# Patient Record
Sex: Female | Born: 1985 | Race: White | Hispanic: No | Marital: Married | Smoking: Never smoker
Health system: Southern US, Community
[De-identification: ages and names within clinical notes are randomized; demographics above are authoritative.]

## PROBLEM LIST (undated history)

## (undated) DIAGNOSIS — F32A Depression, unspecified: Secondary | ICD-10-CM

## (undated) DIAGNOSIS — R87810 Cervical high risk human papillomavirus (HPV) DNA test positive: Secondary | ICD-10-CM

## (undated) DIAGNOSIS — G43829 Menstrual migraine, not intractable, without status migrainosus: Secondary | ICD-10-CM

## (undated) DIAGNOSIS — F329 Major depressive disorder, single episode, unspecified: Secondary | ICD-10-CM

## (undated) DIAGNOSIS — F419 Anxiety disorder, unspecified: Secondary | ICD-10-CM

## (undated) DIAGNOSIS — R87610 Atypical squamous cells of undetermined significance on cytologic smear of cervix (ASC-US): Secondary | ICD-10-CM

## (undated) DIAGNOSIS — N946 Dysmenorrhea, unspecified: Secondary | ICD-10-CM

## (undated) HISTORY — DX: Menstrual migraine, not intractable, without status migrainosus: G43.829

## (undated) HISTORY — DX: Atypical squamous cells of undetermined significance on cytologic smear of cervix (ASC-US): R87.610

## (undated) HISTORY — DX: Anxiety disorder, unspecified: F41.9

## (undated) HISTORY — DX: Cervical high risk human papillomavirus (HPV) DNA test positive: R87.810

## (undated) HISTORY — DX: Dysmenorrhea, unspecified: N94.6

## (undated) HISTORY — DX: Major depressive disorder, single episode, unspecified: F32.9

## (undated) HISTORY — DX: Depression, unspecified: F32.A

---

## 2011-05-11 DIAGNOSIS — R87610 Atypical squamous cells of undetermined significance on cytologic smear of cervix (ASC-US): Secondary | ICD-10-CM

## 2011-05-11 HISTORY — DX: Atypical squamous cells of undetermined significance on cytologic smear of cervix (ASC-US): R87.610

## 2011-05-11 HISTORY — PX: COLPOSCOPY: SHX161

## 2012-03-04 ENCOUNTER — Ambulatory Visit: Payer: Self-pay

## 2012-09-01 ENCOUNTER — Ambulatory Visit: Payer: Self-pay | Admitting: Family Medicine

## 2012-11-23 ENCOUNTER — Ambulatory Visit: Payer: Self-pay

## 2014-05-25 IMAGING — CR DG ANKLE COMPLETE 3+V*L*
1 series · 6 of 6 positions shown · non-contrast
Comparison: none

REASON FOR EXAM: Fall today on 11/23/2012 down bottom step and twisted
ankle
COMMENTS:

[Series 1: ap · 0.17mm/px · 6 of 6 slices shown]
[im 1/6]
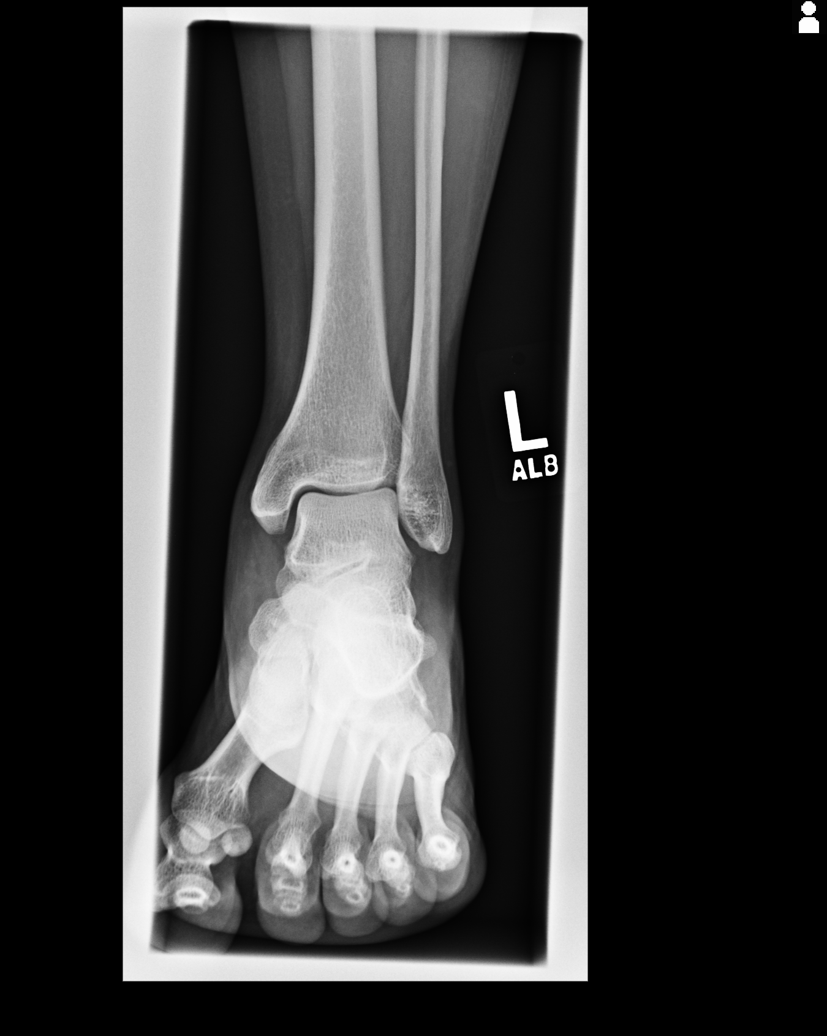
[im 2/6]
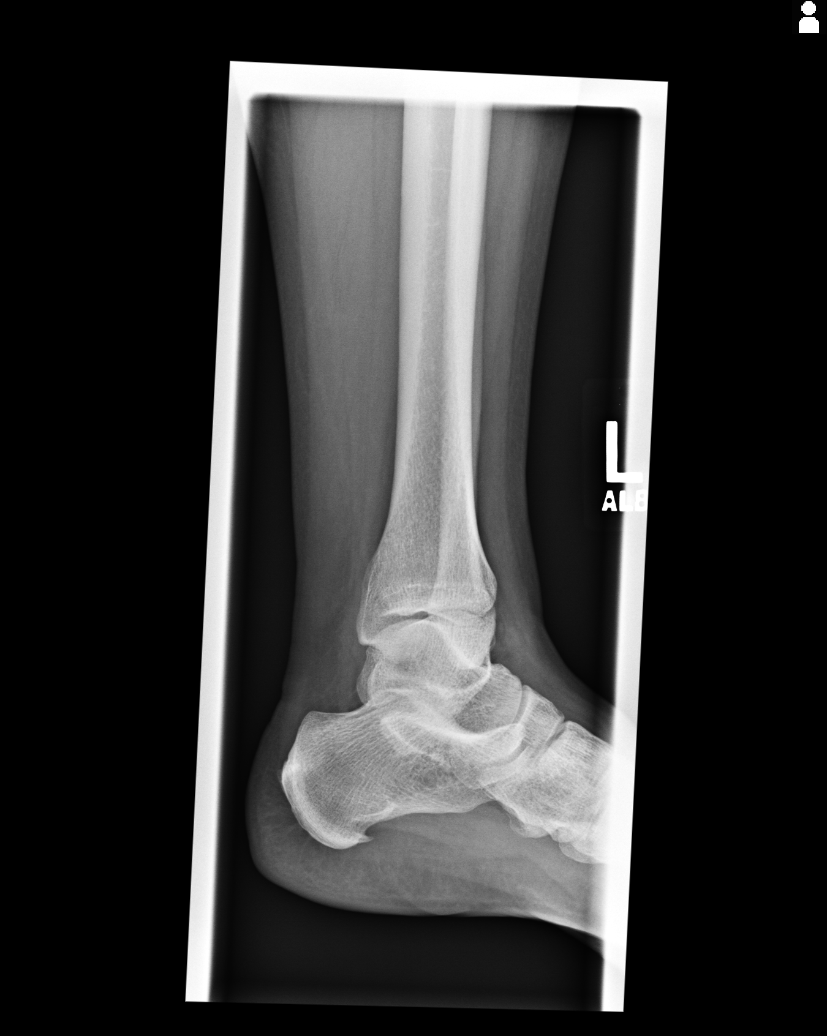
[im 3/6]
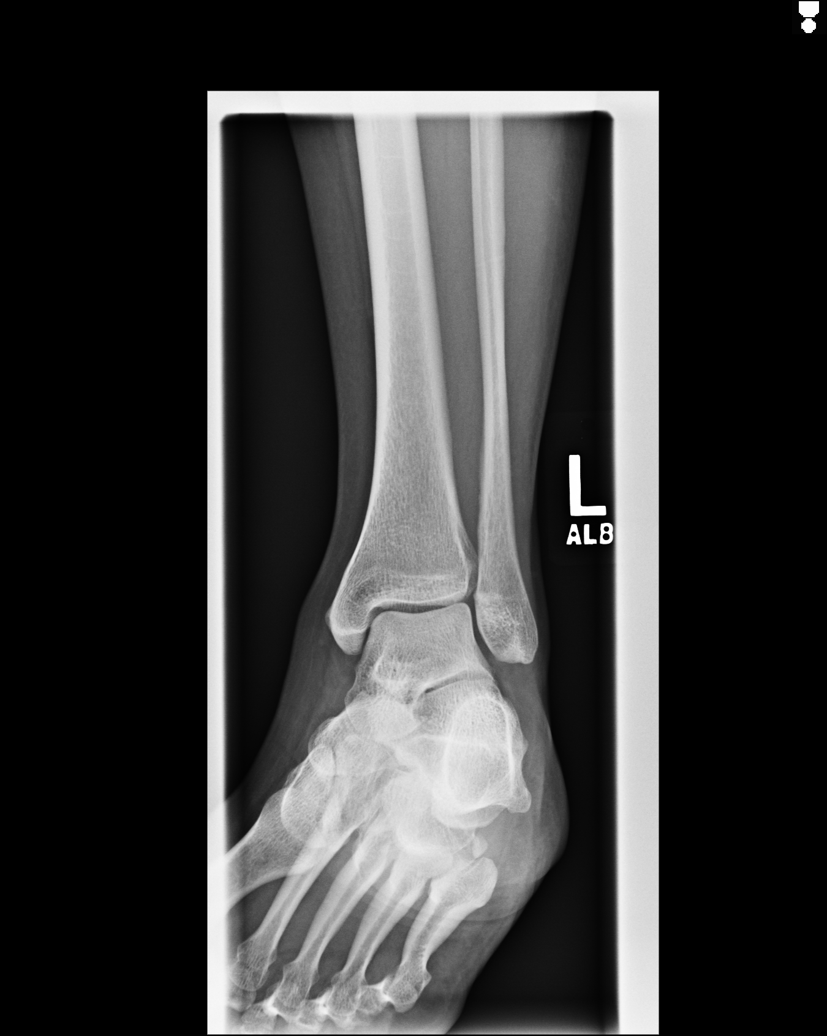
[im 4/6]
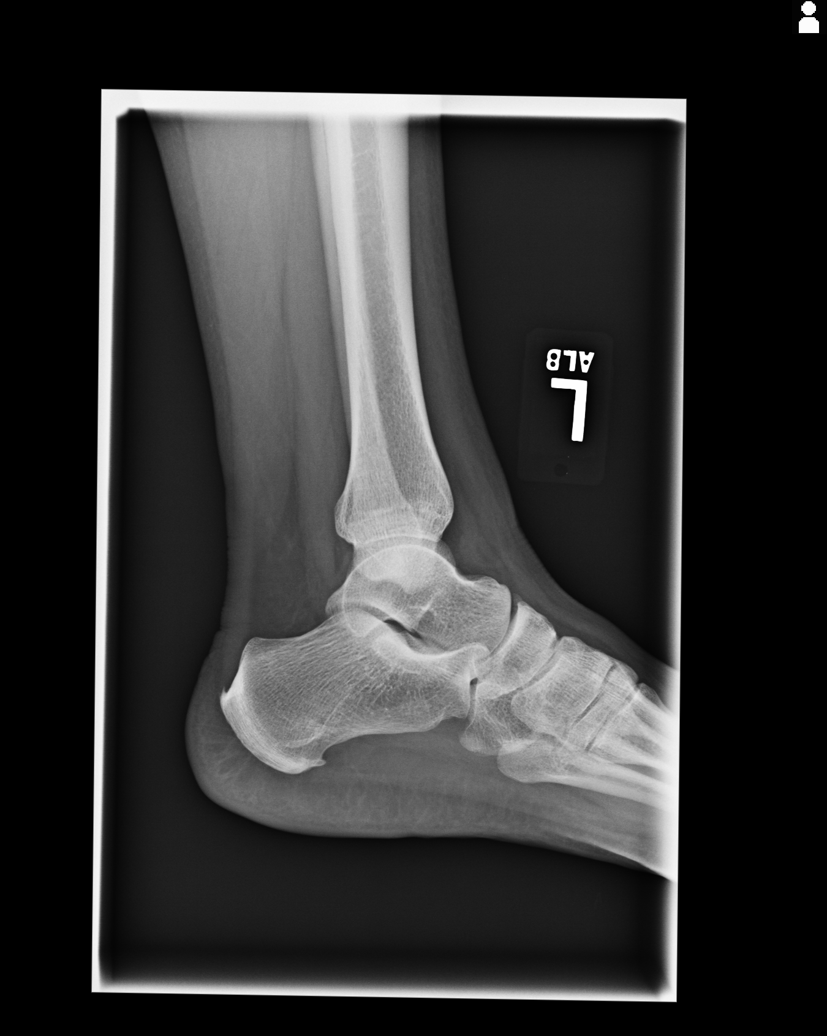
[im 5/6]
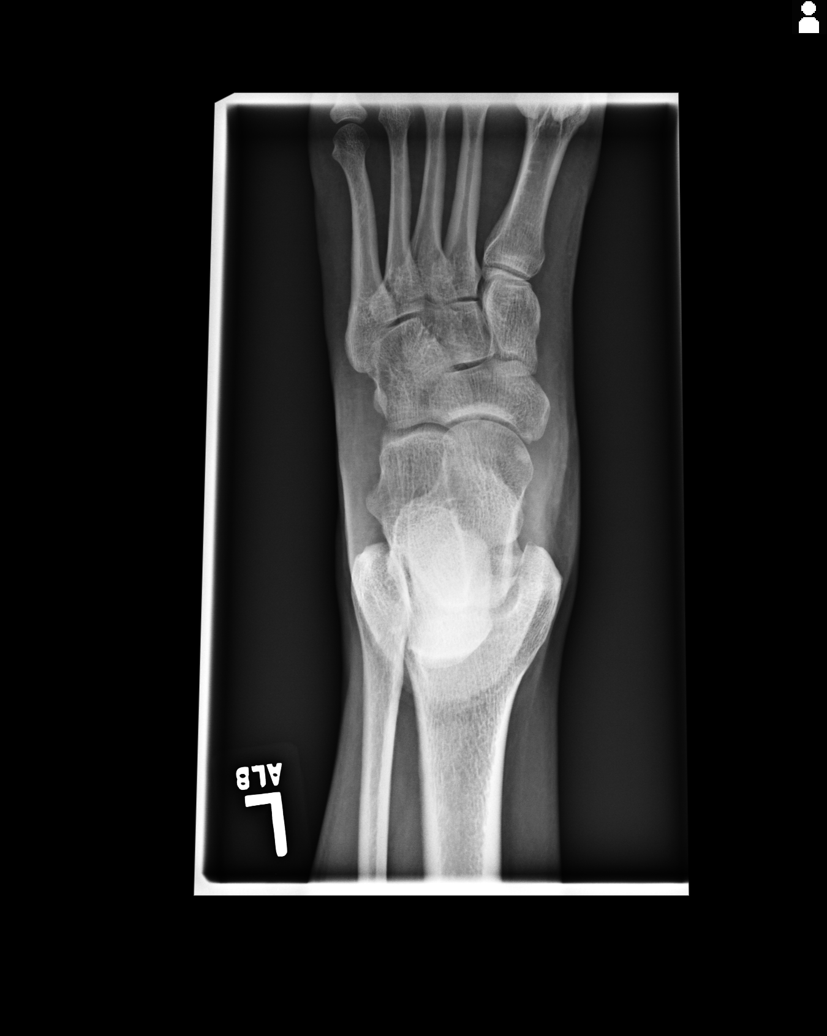
[im 6/6]
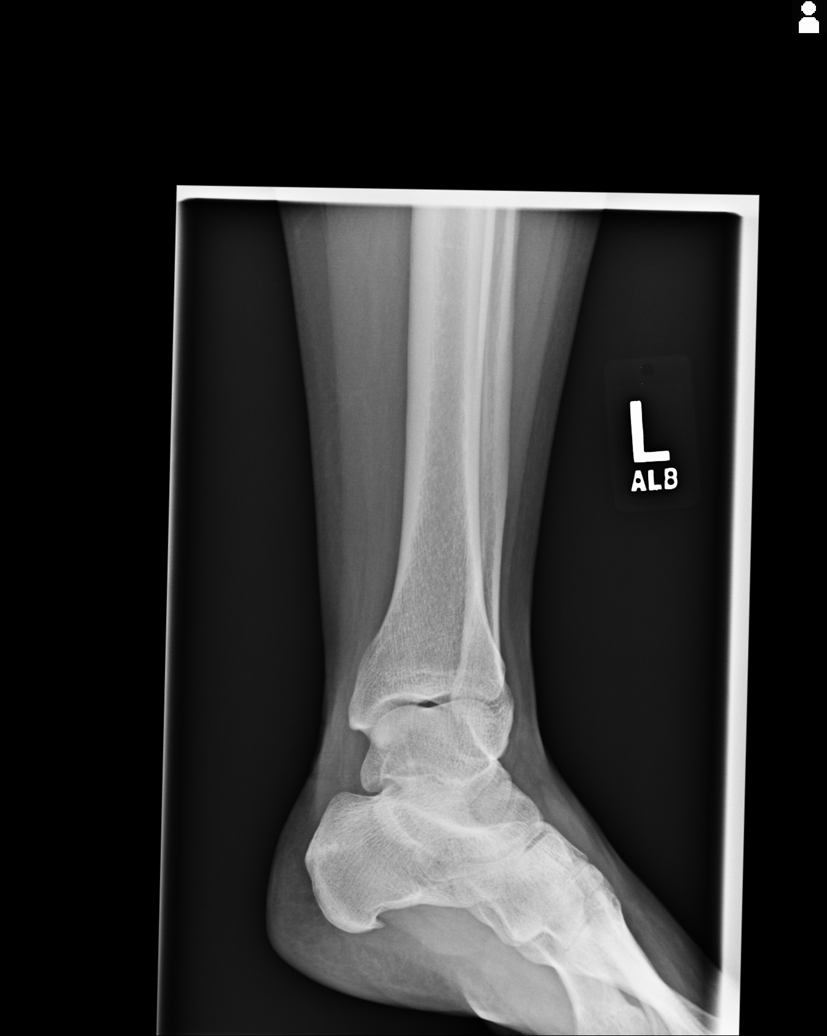

[6 of 6 positions shown; findings below may reference images not displayed]

PROCEDURE:     MDR - MDR ANKLE LEFT COMPLETE  - November 23, 2012  [DATE]

RESULT:     Four views of the left ankle reveal the joint mortise to be
preserved. The talar dome is intact. There is no evidence of an acute
malleolar fracture. The talus and calcaneus appear intact. There is a
plantar calcaneal spur. The metatarsal bases are intact.
IMPRESSION: There is no acute bony abnormality of the left ankle. There
is a plantar calcaneal spur.

[REDACTED]

## 2017-01-25 ENCOUNTER — Ambulatory Visit (INDEPENDENT_AMBULATORY_CARE_PROVIDER_SITE_OTHER): Payer: BC Managed Care – PPO | Admitting: Obstetrics and Gynecology

## 2017-01-25 ENCOUNTER — Encounter: Payer: Self-pay | Admitting: Obstetrics and Gynecology

## 2017-01-25 VITALS — BP 136/74 | HR 84 | Ht 62.0 in | Wt 164.0 lb

## 2017-01-25 DIAGNOSIS — Z30014 Encounter for initial prescription of intrauterine contraceptive device: Secondary | ICD-10-CM

## 2017-01-25 DIAGNOSIS — Z01419 Encounter for gynecological examination (general) (routine) without abnormal findings: Secondary | ICD-10-CM

## 2017-01-25 MED ORDER — MISOPROSTOL 100 MCG PO TABS: 100.0000 ug | ORAL_TABLET | Freq: Once | ORAL | 0 refills | Status: AC

## 2017-01-25 NOTE — Progress Notes (Signed)
PCP:  Patient, No Pcp Per   Chief Complaint  Patient presents with  . Gynecologic Exam    wanting to change ocp d/t migraines     HPI:      Ms. Priscilla Gibson is a 31 y.o. No obstetric history on file. who LMP was Patient's last menstrual period was 01/18/2017., presents today for her annual examination.  Her menses are regular every 28-30 days, lasting 3 days.  Dysmenorrhea none. She does not have intermenstrual bleeding. She has a hx of menstrual migraines with aura. Pt states headaches resolved after stopping OCPs.   Sex activity: single partner, contraception - condoms all the time/withdrawal. Pt is interested in other BC--preferably with little to no hormones since migraines increased with OCPs. Last Pap: December 09, 2014  Results were: no abnormalities /neg HPV DNA  Hx of STDs: HPV  There is a FH of breast cancer in her MGM, genetic testing not indicated. There is no FH of ovarian cancer. The patient does not do self-breast exams.  Tobacco use: The patient denies current or previous tobacco use. Alcohol use: social drinker No drug use.  Exercise: moderately active  She does get adequate calcium and Vitamin D in her diet.  Grief anxiety and depression have resolved. Pt doing fine.    Past Medical History:  Diagnosis Date  . Anxiety and depression   . 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 Dysmenorrhea   . Migraine, menstrual    with aura    Past Surgical History:  Procedure Laterality Date  . COLPOSCOPY  2013   neg bx    Family History  Problem Relation Age of Onset  . Breast cancer Maternal Grandmother 50  . Brain cancer Maternal Grandmother 44    Social History   Social History  . Marital status: Married    Spouse name: N/A  . Number of children: N/A  . Years of education: N/A   Occupational History  . Not on file.   Social History Main Topics  . Smoking status: Never Smoker  . Smokeless tobacco: Never Used  . Alcohol use Yes  .  Drug use: No  . Sexual activity: Yes    Partners: Male    Birth control/ protection: None   Other Topics Concern  . Not on file   Social History Narrative  . No narrative on file    No outpatient prescriptions have been marked as taking for the 01/25/17 encounter (Office Visit) with Neeka Urista, Priscilla Sorrel, PA-C.     ROS:  Review of Systems  Constitutional: Negative for fatigue, fever and unexpected weight change.  Respiratory: Negative for cough, shortness of breath and wheezing.   Cardiovascular: Negative for chest pain, palpitations and leg swelling.  Gastrointestinal: Negative for blood in stool, constipation, diarrhea, nausea and vomiting.  Endocrine: Negative for cold intolerance, heat intolerance and polyuria.  Genitourinary: Negative for dyspareunia, dysuria, flank pain, frequency, genital sores, hematuria, menstrual problem, pelvic pain, urgency, vaginal bleeding, vaginal discharge and vaginal pain.  Musculoskeletal: Negative for back pain, joint swelling and myalgias.  Skin: Negative for rash.  Neurological: Negative for dizziness, syncope, light-headedness, numbness and headaches.  Hematological: Negative for adenopathy.  Psychiatric/Behavioral: Negative for agitation, confusion, sleep disturbance and suicidal ideas. The patient is not nervous/anxious.      Objective: BP 136/74 (BP Location: Left Arm, Patient Position: Sitting, Cuff Size: Normal)   Pulse 84   Ht  (1.575 m)   Wt 164 lb (74.4  kg)   LMP 01/18/2017   BMI 30.00 kg/m    Physical Exam  Constitutional: She is oriented to person, place, and time. She appears well-developed and well-nourished.  Genitourinary: Vagina normal and uterus normal. There is no rash or tenderness on the right labia. There is no rash or tenderness on the left labia. No erythema or tenderness in the vagina. No vaginal discharge found. Right adnexum does not display mass and does not display tenderness. Left adnexum does not display  mass and does not display tenderness. Cervix does not exhibit motion tenderness or polyp. Uterus is not enlarged or tender.  Neck: Normal range of motion. No thyromegaly present.  Cardiovascular: Normal rate, regular rhythm and normal heart sounds.   No murmur heard. Pulmonary/Chest: Effort normal and breath sounds normal. Right breast exhibits no mass, no nipple discharge, no skin change and no tenderness. Left breast exhibits no mass, no nipple discharge, no skin change and no tenderness.  Abdominal: Soft. There is no tenderness. There is no guarding.  Musculoskeletal: Normal range of motion.  Neurological: She is alert and oriented to person, place, and time. No cranial nerve deficit.  Psychiatric: She has a normal mood and affect. Her behavior is normal.  Vitals reviewed.   Assessment/Plan: Encounter for annual routine gynecological examination  Encounter for initial prescription of intrauterine contraceptive device (IUD) - All BC options discussed. Recommend kyleena vs paragard. [facility] handout given. RTO with next menses for IUD insertion--kyleena vs paragard. Rx cytotec/NSAIDs.   Meds ordered this encounter  Medications  . misoprostol (CYTOTEC) 100 MCG tablet    Sig: Take 1 tablet (100 mcg total) by mouth once. 1 hour before appt    Dispense:  1 tablet    Refill:  0             GYN counsel STD prevention, family planning choices, adequate intake of calcium and vitamin D, diet and exercise     F/U  Return in about 1 year (around 01/25/2018).  Blaklee [place]ores B. Tykia Mellone, PA-C 01/25/2017 4:14 PM

## 2017-02-17 ENCOUNTER — Ambulatory Visit (INDEPENDENT_AMBULATORY_CARE_PROVIDER_SITE_OTHER): Payer: BC Managed Care – PPO | Admitting: Obstetrics and Gynecology

## 2017-02-17 ENCOUNTER — Encounter: Payer: Self-pay | Admitting: Obstetrics and Gynecology

## 2017-02-17 VITALS — BP 110/80 | HR 78 | Ht 61.0 in | Wt 167.0 lb

## 2017-02-17 DIAGNOSIS — Z30430 Encounter for insertion of intrauterine contraceptive device: Secondary | ICD-10-CM

## 2017-02-17 NOTE — Patient Instructions (Signed)

## 2017-02-17 NOTE — Progress Notes (Signed)
   Chief Complaint  Patient presents with  . Priscilla Gibson IUD INSERTION     IUD PROCEDURE NOTE:  Priscilla Gibson is a 31 y.o. [id number] here for Pal[place]  IUD insertion. No GYN concerns.  Wants for [facility].    BP 110/80   Pulse 78   Ht  (1.549 m)   Wt 167 lb (75.8 kg)   LMP 02/17/2017 (Exact Date)   BMI 31.55 kg/m   IUD Insertion Procedure Note Patient identified, informed consent performed, consent signed.   Discussed risks of irregular bleeding, cramping, infection, malpositioning or misplacement of the IUD outside the uterus which may require further procedure such as laparoscopy, risk of failure <1%. Time out was performed.   A bimanual exam showed the uterus to be anteverted.  Speculum placed in the vagina.  Cervix visualized.  Cleaned with Betadine x 2.  Grasped anteriorly with a single tooth tenaculum.  Uterus sounded to 7.0 cm.   IUD placed per manufacturer's recommendations.  Strings trimmed to 3 cm. Tenaculum was removed, good hemostasis noted.  Patient tolerated procedure well.   ASSESSMENT:  Encounter for IUD insertion   Meds ordered this encounter  Medications  . Levonorgestrel (Priscilla Gibson) 19.5 MG IUD    Sig: 1 Device by Intrauterine route.     Plan:  Patient was given post-procedure instructions.  She was advised to have backup contraception for one week.   Call if you are having increasing pain, cramps or bleeding or if you have a fever greater than 100.4 degrees F., shaking chills, nausea or vomiting. Patient was also asked to check IUD strings periodically and follow up in 4 weeks for IUD check.  Return in about 4 weeks (around 03/17/2017) for IUD f/u.  Alicia B. Copland, PA-C 02/18/2017 12:06 PM

## 2017-02-21 ENCOUNTER — Telehealth: Payer: Self-pay | Admitting: Obstetrics and Gynecology

## 2017-02-21 NOTE — Telephone Encounter (Signed)
Pt is schedule 03/16/17 °

## 2017-02-21 NOTE — Telephone Encounter (Signed)
Copland, Alicia B, PA-C  P Ws-Admin        Pls call pt to sched 4 wk IUD f/u appt (left after power outage yesterday). Thx   Attempted to reach patient. Unable to leave voicemail due to vmb being full

## 2017-03-16 ENCOUNTER — Ambulatory Visit (INDEPENDENT_AMBULATORY_CARE_PROVIDER_SITE_OTHER): Payer: BC Managed Care – PPO | Admitting: Obstetrics and Gynecology

## 2017-03-16 ENCOUNTER — Encounter: Payer: Self-pay | Admitting: Obstetrics and Gynecology

## 2017-03-16 VITALS — BP 130/90 | HR 81 | Ht 62.0 in | Wt 170.0 lb

## 2017-03-16 DIAGNOSIS — Z30431 Encounter for routine checking of intrauterine contraceptive device: Secondary | ICD-10-CM

## 2017-03-16 NOTE — Progress Notes (Signed)
   Chief Complaint  Patient presents with  . Contraception     History of Present Illness:  Priscilla Gibson is a 31 y.o. that had a [place]Kyleena IUD placed approximately 1 month ago. Since that time, she denies dyspareunia, pelvic pain, vaginal d/c, heavy bleeding. She has had a little spotting since insertion and is having period now.  Review of Systems  Constitutional: Negative for fever.  Gastrointestinal: Negative for blood in stool, constipation, diarrhea, nausea and vomiting.  Genitourinary: Positive for vaginal bleeding. Negative for dyspareunia, dysuria, flank pain, frequency, hematuria, urgency, vaginal discharge and vaginal pain.  Musculoskeletal: Negative for back pain.  Skin: Negative for rash.    Physical Exam:  BP 130/90   Pulse 81   Ht 5\' 2"  (1.575 m)   Wt 170 lb (77.1 kg)   LMP 03/15/2017   BMI 31.09 kg/m  Body mass index is 31.09 kg/m.  Pelvic exam:  Two IUD strings present seen coming from the cervical os. EGBUS, vaginal vault and cervix: within normal limits   Assessment:  Routine checking of IUD Encounter for routine checking of intrauterine contraceptive device (IUD)  IUD strings present in proper location; pt doing well  Plan: F/u if any signs of infection or can no longer feel the strings.   Alicia B. Copland, PA-C 03/16/2017 4:35 PM

## 2017-03-16 NOTE — Patient Instructions (Signed)
I value your feedback and appreciate you entrusting us with your care. If you get a [facility] patient survey, I would appreciate you taking the time to let us know what your experience was like. Thank you! 

## 2019-07-14 ENCOUNTER — Ambulatory Visit: Payer: BC Managed Care – PPO | Attending: Internal Medicine

## 2019-07-15 ENCOUNTER — Ambulatory Visit: Payer: BC Managed Care – PPO | Attending: Internal Medicine

## 2019-07-15 DIAGNOSIS — Z23 Encounter for immunization: Secondary | ICD-10-CM

## 2019-07-15 NOTE — Progress Notes (Signed)
   Covid-19 Vaccination Clinic  Name:  Priscilla Gibson    MRN: [id number] DOB: [birth year]  07/15/2019  [name] was observed post Covid-19 immunization for 15 minutes without incident. She was provided with Vaccine Information Sheet and instruction to access the V-Safe system.   Ms. Vessels was instructed to call 911 with any severe reactions post vaccine: Marland Kitchen Difficulty breathing  . Swelling of face and throat  . A fast heartbeat  . A bad rash all over body  . Dizziness and weakness   Immunizations Administered    Name Date Dose VIS Date Route   Pfizer COVID-19 Vaccine 07/15/2019  8:17 AM 0.3 mL 04/20/2019 Intramuscular   Manufacturer: [employer], [employer]   Lot: [id number]   NDC: [address]-1

## 2019-08-07 ENCOUNTER — Ambulatory Visit: Payer: BC Managed Care – PPO | Attending: Internal Medicine

## 2019-08-07 DIAGNOSIS — Z23 Encounter for immunization: Secondary | ICD-10-CM

## 2019-08-07 NOTE — Progress Notes (Signed)
   Covid-19 Vaccination Clinic  Name:  Priscilla Gibson    MRN: [id number] DOB: 1986-01-23  08/07/2019  [name] was observed post Covid-19 immunization for 15 minutes without incident. She was provided with Vaccine Information Sheet and instruction to access the V-Safe system.   [name] was instructed to call 911 with any severe reactions post vaccine: Marland Kitchen Difficulty breathing  . Swelling of face and throat  . A fast heartbeat  . A bad rash all over body  . Dizziness and weakness   Immunizations Administered    Name Date Dose VIS Date Route   Pfizer COVID-19 Vaccine 08/07/2019  3:22 PM 0.3 mL 04/20/2019 Intramuscular   Manufacturer: [employer], [employer]   Lot: [id number]   NDC: [address]-9

## 2023-10-03 ENCOUNTER — Ambulatory Visit
Admission: EM | Admit: 2023-10-03 | Discharge: 2023-10-03 | Disposition: A | Discharge status: Home / Self Care | Attending: Emergency Medicine | Admitting: Emergency Medicine

## 2023-10-03 ENCOUNTER — Encounter: Payer: Self-pay | Admitting: *Deleted

## 2023-10-03 DIAGNOSIS — J029 Acute pharyngitis, unspecified: Secondary | ICD-10-CM

## 2023-10-03 LAB — GROUP A STREP BY PCR: Group A Strep by PCR: NOT DETECTED

## 2023-10-03 NOTE — ED Triage Notes (Signed)
 Patient states sore throat on left for 3 days and swollen tonsils.

## 2023-10-03 NOTE — ED Provider Notes (Signed)
 [facility]    CSN: [id number] Arrival date & time: 10/03/23  8295      History   Chief Complaint Chief Complaint  Patient presents with   Sore Throat    HPI Priscilla Gibson is a 38 y.o. female.   38 year old female, Priscilla Gibson, presents to urgent care for evaluation of sore throat( left worse than right) for 3 days.  Patient states feels like her tonsils are swollen.  Patient states she is able to eat and drink well, airway patent.  Has been using warm salt water gargles without relief.  Patient states she works as a [employer].  Patient denies smoking drinking or drug use  PMH: Anxiety, depression, migraine  The history is provided by the patient. No language interpreter was used.    Past Medical History:  Diagnosis Date   Anxiety and depression    ASCUS with positive high risk HPV cervical 2013   Dysmenorrhea    Migraine, menstrual    with aura    Patient Active Problem List   Diagnosis Date Noted   Sore throat 10/03/2023    Past Surgical History:  Procedure Laterality Date   COLPOSCOPY  2013   neg bx    OB History     Gravida  0   Para  0   Term  0   Preterm  0   AB  0   Living  0      SAB  0   IAB  0   Ectopic  0   Multiple  0   Live Births  0            Home Medications    Prior to Admission medications   Medication Sig Start Date End Date Taking? Authorizing Provider  fenofibrate (TRICOR) 145 MG tablet Take 145 mg by mouth daily.    [provider]  Levonorgestrel  ([name] ) 19.5 MG IUD 1 Device by Intrauterine route.    [provider]  metoprolol succinate (TOPROL-XL) 50 MG 24 hr tablet Take 50 mg by mouth daily.    [provider]  misoprostol  (CYTOTEC ) 100 MCG tablet Take 1 tablet (100 mcg total) by mouth once. 1 hour before appt 01/25/17 01/25/17  Copland, Amada Jun, PA-C    Family History Family History  Problem Relation Age of Onset   Breast cancer Maternal  Grandmother 80   Brain cancer Maternal Grandmother 14    Social History Social History   Tobacco Use   Smoking status: Never   Smokeless tobacco: Never  Vaping Use   Vaping status: Never Used  Substance Use Topics   Alcohol use: Not Currently   Drug use: No     Allergies   Patient has no known allergies.   Review of Systems Review of Systems  Constitutional:  Negative for fever.  HENT:  Positive for sore throat and trouble swallowing.   Gastrointestinal:  Negative for diarrhea, nausea and vomiting.  All other systems reviewed and are negative.    Physical Exam Triage Vital Signs ED Triage Vitals  Encounter Vitals Group     BP 10/03/23 1007 136/87     Systolic BP Percentile --      Diastolic BP Percentile --      Pulse Rate 10/03/23 1007 79     Resp 10/03/23 1007 20     Temp 10/03/23 1007 98.6 F (37 C)     Temp Source 10/03/23 1007 Oral     SpO2  10/03/23 1007 97 %     Weight 10/03/23 1005 180 lb (81.6 kg)     Height 10/03/23 1005 5\' 1"  (1.549 m)     Head Circumference --      Peak Flow --      Pain Score 10/03/23 1004 6     Pain Loc --      Pain Education --      Exclude from Growth Chart --    No data found.  Updated Vital Signs BP 136/87 (BP Location: Right Arm)   Pulse 79   Temp 98.6 F (37 C) (Oral)   Resp 20   Ht 5\' 1"  (1.549 m)   Wt 180 lb (81.6 kg)   SpO2 97%   BMI 34.01 kg/m   Visual Acuity Right Eye Distance:   Left Eye Distance:   Bilateral Distance:    Right Eye Near:   Left Eye Near:    Bilateral Near:     Physical Exam Vitals and nursing note reviewed.  Constitutional:      General: She is not in acute distress.    Appearance: She is well-developed.  HENT:     Head: Normocephalic.     Right Ear: Tympanic membrane is retracted.     Left Ear: Tympanic membrane is retracted.     Nose: Congestion present.     Mouth/Throat:     Lips: Pink.     Mouth: Mucous membranes are moist.     Palate: Lesions present.     Pharynx:  Uvula midline. Posterior oropharyngeal erythema present.     Tonsils: No tonsillar exudate or tonsillar abscesses.  Eyes:     General: Lids are normal.     Conjunctiva/sclera: Conjunctivae normal.     Pupils: Pupils are equal, round, and reactive to light.  Neck:     Trachea: No tracheal deviation.  Cardiovascular:     Rate and Rhythm: Normal rate and regular rhythm.     Heart sounds: Normal heart sounds. No murmur heard. Pulmonary:     Effort: Pulmonary effort is normal.     Breath sounds: Normal breath sounds and air entry.  Abdominal:     General: Bowel sounds are normal.     Palpations: Abdomen is soft.     Tenderness: There is no abdominal tenderness.  Musculoskeletal:        General: Normal range of motion.     Cervical back: Normal range of motion.  Lymphadenopathy:     Cervical: No cervical adenopathy.  Skin:    General: Skin is warm and dry.     Findings: No rash.  Neurological:     General: No focal deficit present.     Mental Status: She is alert and oriented to person, place, and time.     GCS: GCS eye subscore is 4. GCS verbal subscore is 5. GCS motor subscore is 6.  Psychiatric:        Attention and Perception: Attention normal.        Mood and Affect: Mood normal.        Speech: Speech normal.        Behavior: Behavior normal. Behavior is cooperative.      UC Treatments / Results  Labs (all labs ordered are listed, but only abnormal results are displayed) Labs Reviewed  GROUP A STREP BY PCR    EKG   Radiology No results found.  Procedures Procedures (including critical care time)  Medications Ordered in UC Medications - No  data to display  Initial Impression / Assessment and Plan / UC Course  I have reviewed the triage vital signs and the nursing notes.  Pertinent labs & imaging results that were available during my care of the patient were reviewed by me and considered in my medical decision making (see chart for details).    Discussed  exam findings and plan of care with patient, patient verbalized understanding this provider, strict go to ER precautions given  Ddx: Viral pharyngitis, sore throat, allergies Final Clinical Impressions(s) / UC Diagnoses   Final diagnoses:  Viral pharyngitis  Sore throat     Discharge Instructions      Your strep test is negative Most likely you have a viral illness: no antibiotic is indicated at this time, May treat with OTC meds of choice(chloraseptic throat lozenges, tylenol,ibuprofen, etc), warm salt water gargles. Make sure to drink plenty of fluids to stay hydrated(gatorade, water, popsicles,jello,etc), avoid caffeine products. Follow up with PCP. Return as needed.   ED Prescriptions   None    PDMP not reviewed this encounter.   Peter Brands, NP 10/03/23 1422

## 2023-10-03 NOTE — Discharge Instructions (Signed)
 Your strep test is negative Most likely you have a viral illness: no antibiotic is indicated at this time, May treat with OTC meds of choice(chloraseptic throat lozenges, tylenol,ibuprofen, etc), warm salt water gargles. Make sure to drink plenty of fluids to stay hydrated(gatorade, water, popsicles,jello,etc), avoid caffeine products. Follow up with PCP. Return as needed.
